# Patient Record
Sex: Female | Born: 1940 | Race: Black or African American | Hispanic: No | Marital: Married | Smoking: Never smoker
Health system: Southern US, Community
[De-identification: ages and names within clinical notes are randomized; demographics above are authoritative.]

---

## 2019-10-28 ENCOUNTER — Emergency Department (HOSPITAL_BASED_OUTPATIENT_CLINIC_OR_DEPARTMENT_OTHER)
Admission: EM | Admit: 2019-10-28 | Discharge: 2019-10-28 | Disposition: A | Discharge status: Home / Self Care | Payer: Medicare Other | Attending: Emergency Medicine | Admitting: Emergency Medicine

## 2019-10-28 ENCOUNTER — Encounter (HOSPITAL_BASED_OUTPATIENT_CLINIC_OR_DEPARTMENT_OTHER): Payer: Self-pay | Admitting: *Deleted

## 2019-10-28 ENCOUNTER — Other Ambulatory Visit: Payer: Self-pay

## 2019-10-28 ENCOUNTER — Emergency Department (HOSPITAL_BASED_OUTPATIENT_CLINIC_OR_DEPARTMENT_OTHER): Payer: Medicare Other

## 2019-10-28 DIAGNOSIS — Z7982 Long term (current) use of aspirin: Secondary | ICD-10-CM | POA: Diagnosis not present

## 2019-10-28 DIAGNOSIS — L0291 Cutaneous abscess, unspecified: Secondary | ICD-10-CM

## 2019-10-28 DIAGNOSIS — L0211 Cutaneous abscess of neck: Secondary | ICD-10-CM | POA: Diagnosis not present

## 2019-10-28 DIAGNOSIS — R221 Localized swelling, mass and lump, neck: Secondary | ICD-10-CM | POA: Diagnosis present

## 2019-10-28 LAB — BASIC METABOLIC PANEL
Anion gap: 13 (ref 5–15)
BUN: 14 mg/dL (ref 8–23)
CO2: 23 mmol/L (ref 22–32)
Calcium: 9.4 mg/dL (ref 8.9–10.3)
Chloride: 105 mmol/L (ref 98–111)
Creatinine, Ser: 0.57 mg/dL (ref 0.44–1.00)
GFR calc Af Amer: >60 mL/min (ref >60)
GFR calc non Af Amer: >60 mL/min (ref >60)
Glucose, Bld: 113 mg/dL — ABNORMAL HIGH (ref 70–99)
Potassium: 3.8 mmol/L (ref 3.5–5.1)
Sodium: 141 mmol/L (ref 135–145)

## 2019-10-28 LAB — CBC WITH DIFFERENTIAL/PLATELET
Abs Immature Granulocytes: 0.01 10*3/uL (ref 0.00–0.07)
Basophils Absolute: 0 10*3/uL (ref 0.0–0.1)
Basophils Relative: 0 %
Eosinophils Absolute: 0 10*3/uL (ref 0.0–0.5)
Eosinophils Relative: 1 %
HCT: 39.2 % (ref 36.0–46.0)
Hemoglobin: 12.8 g/dL (ref 12.0–15.0)
Immature Granulocytes: 0 %
Lymphocytes Relative: 19 %
Lymphs Abs: 1.1 10*3/uL (ref 0.7–4.0)
MCH: 31 pg (ref 26.0–34.0)
MCHC: 32.7 g/dL (ref 30.0–36.0)
MCV: 94.9 fL (ref 80.0–100.0)
Monocytes Absolute: 0.5 10*3/uL (ref 0.1–1.0)
Monocytes Relative: 8 %
Neutro Abs: 4.3 10*3/uL (ref 1.7–7.7)
Neutrophils Relative %: 72 %
Platelets: 223 10*3/uL (ref 150–400)
RBC: 4.13 MIL/uL (ref 3.87–5.11)
RDW: 12.9 % (ref 11.5–15.5)
WBC: 6 10*3/uL (ref 4.0–10.5)
nRBC: 0 % (ref 0.0–0.2)

## 2019-10-28 MED ORDER — LIDOCAINE HCL (PF) 1 % IJ SOLN
10.0000 mL | Freq: Once | INTRAMUSCULAR | Status: AC
  Administered 2019-10-28: 10 mL via INTRADERMAL
  Filled 2019-10-28: qty 10

## 2019-10-28 MED ORDER — KETOROLAC TROMETHAMINE 15 MG/ML IJ SOLN
15.0000 mg | Freq: Once | INTRAMUSCULAR | Status: AC
  Administered 2019-10-28: 15 mg via INTRAVENOUS
  Filled 2019-10-28: qty 1

## 2019-10-28 MED ORDER — CEPHALEXIN 500 MG PO CAPS: 500.0000 mg | ORAL_CAPSULE | Freq: Four times a day (QID) | ORAL | 0 refills | Status: AC

## 2019-10-28 MED ORDER — CEPHALEXIN 250 MG PO CAPS
500.0000 mg | ORAL_CAPSULE | Freq: Once | ORAL | Status: AC
  Administered 2019-10-28: 500 mg via ORAL
  Filled 2019-10-28: qty 2

## 2019-10-28 MED ORDER — IOHEXOL 300 MG/ML  SOLN
100.0000 mL | Freq: Once | INTRAMUSCULAR | Status: AC
  Administered 2019-10-28: 75 mL via INTRAVENOUS

## 2019-10-28 NOTE — Discharge Instructions (Addendum)
You should return to our ER or visit your doctor in 4-5 days to have your packing removed from your wound and have your abscess re-examined.  Please take the full course of antibiotics as prescribed.  You can take tylenol or motrin at home for pain.

## 2019-10-28 NOTE — ED Triage Notes (Signed)
Pt sent here from [facility]D office for eval abscess to neck

## 2019-10-28 NOTE — ED Provider Notes (Signed)
[facility] Provider Note   CSN: [id number] Arrival date & time: 10/28/19  1710     History Chief Complaint  Patient presents with  . Abscess    Georgina Krist is a 79 y.o. female presenting to ED with anterior neck mass  Popped pimple on chin last week, some swelling there at the time Got covid vaccine Saturday, swelling worsened since then. Went to see PCP today who told her to come to ED for drainage  No fevers, chills No difficulty speaking or swallowing No tongue swelling No hx of diabetes   HPI     History reviewed. No pertinent past medical history.  There are no problems to display for this patient.   History reviewed. No pertinent surgical history.   OB History   No obstetric history on file.     No family history on file.  Social History   Tobacco Use  . Smoking status: Never Smoker  Substance Use Topics  . Alcohol use: Not Currently  . Drug use: Not Currently    Home Medications Prior to Admission medications   Medication Sig Start Date End Date Taking? Authorizing Provider  aspirin 81 MG EC tablet Take by mouth. 06/26/16  Yes [provider]  cephALEXin (KEFLEX) 500 MG capsule Take 1 capsule (500 mg total) by mouth 4 (four) times daily for 5 days. 10/29/19 11/03/19  Wyvonnia Dusky, MD    Allergies    Patient has no known allergies.  Review of Systems   Review of Systems  Constitutional: Negative for chills and fever.  HENT: Positive for facial swelling. Negative for dental problem, drooling, sore throat, trouble swallowing and voice change.   Respiratory: Negative for cough and shortness of breath.   Cardiovascular: Negative for chest pain and palpitations.  Skin: Positive for wound. Negative for rash.  Neurological: Negative for syncope and headaches.  All other systems reviewed and are negative.   Physical Exam Updated Vital Signs BP (!) 151/79   Pulse 72   Temp 98.1 F (36.7 C) (Oral)    Resp 18   Ht 6' (1.829 m)   SpO2 98%   Physical Exam Vitals and nursing note reviewed.  Constitutional:      General: She is not in acute distress.    Appearance: She is well-developed.  HENT:     Head: Normocephalic and atraumatic.     Comments: Small superficial 2 cm region of fluctuance in right submandibular region, with surrounding tender tissue induration No swelling or elevation of the tongue No expression of purulence into the mouth Eyes:     Conjunctiva/sclera: Conjunctivae normal.  Cardiovascular:     Rate and Rhythm: Normal rate and regular rhythm.     Pulses: Normal pulses.  Pulmonary:     Effort: Pulmonary effort is normal. No respiratory distress.  Musculoskeletal:     Cervical back: Neck supple.  Skin:    General: Skin is warm and dry.  Neurological:     Mental Status: She is alert.     ED Results / Procedures / Treatments   Labs (all labs ordered are listed, but only abnormal results are displayed) Labs Reviewed  BASIC METABOLIC PANEL - Abnormal; Notable for the following components:      Result Value   Glucose, Bld 113 (*)    All other components within normal limits  CBC WITH DIFFERENTIAL/PLATELET    EKG None  Radiology CT Soft Tissue Neck W Contrast  Result Date: 10/28/2019 CLINICAL  DATA:  Submandibular mass lesion.  Question abscess. EXAM: CT NECK WITH CONTRAST TECHNIQUE: Multidetector CT imaging of the neck was performed using the standard protocol following the bolus administration of intravenous contrast. CONTRAST:  70mL OMNIPAQUE IOHEXOL 300 MG/ML  SOLN COMPARISON:  None. FINDINGS: Pharynx and larynx: No mucosal or submucosal lesion. Salivary glands: Parotid and submandibular glands are normal. Thyroid: Normal Lymph nodes: No enlarged or low-density nodes seen on either side of the neck. Vascular: No abnormal vascular finding. Limited intracranial: Normal Visualized orbits: Normal Mastoids and visualized paranasal sinuses: Cyst or polyp in the  right division of the sphenoid sinus. Other sinuses are clear. Skeleton: Ordinary cervical spondylosis. Upper chest: Negative Other: 2.5 cm superficial subcutaneous abscess to the right of midline in the submandibular region. This shows central low density. No extension deep to the platysma muscle. Mild regional cellulitis changes present as well superficially. IMPRESSION: 2.5 cm superficial abscess to the right of midline in the submandibular region. No extension deep to the platysma muscle. Regional cellulitis of the adjacent fat and skin. Electronically Signed   By: Paulina Fusi M.D.   On: 10/28/2019 22:02    Procedures .Marland KitchenIncision and Drainage  Date/Time: 10/29/2019 2:04 AM Performed by: Terald Sleeper, MD Authorized by: Terald Sleeper, MD   Consent:    Consent obtained:  Verbal   Consent given by:  Patient   Risks discussed:  Bleeding, incomplete drainage and infection Location:    Type:  Abscess   Size:  2.5 cm   Location:  Neck   Neck location:  R anterior Pre-procedure details:    Skin preparation:  Betadine Anesthesia (see MAR for exact dosages):    Anesthesia method:  Local infiltration   Local anesthetic:  Lidocaine 1% w/o epi Procedure type:    Complexity:  Complex Procedure details:    Incision types:  Single straight   Wound management:  Probed and deloculated, irrigated with saline and extensive cleaning   Drainage:  Purulent   Drainage amount:  Copious   Wound treatment:  Wound left open   Packing materials:  1/4 in gauze Post-procedure details:    Patient tolerance of procedure:  Tolerated well, no immediate complications Comments:     Approx 8-10 cc purulent drainage   (including critical care time)  Medications Ordered in ED Medications  iohexol (OMNIPAQUE) 300 MG/ML solution 100 mL (75 mLs Intravenous Contrast Given 10/28/19 2147)  lidocaine (PF) (XYLOCAINE) 1 % injection 10 mL (10 mLs Intradermal Given 10/28/19 2327)  ketorolac (TORADOL) 15 MG/ML  injection 15 mg (15 mg Intravenous Given 10/28/19 2230)  cephALEXin (KEFLEX) capsule 500 mg (500 mg Oral Given 10/28/19 2322)    ED Course  I have reviewed the triage vital signs and the nursing notes.  Pertinent labs & imaging results that were available during my care of the patient were reviewed by me and considered in my medical decision making (see chart for details).  79 yo female presenting to the ED with abscess in the right submandibular region that started after popping a "blackhead."  CT imaging with:  Other: 2.5 cm superficial subcutaneous abscess to the right of  midline in the submandibular region. This shows central low density.  No extension deep to the platysma muscle. Mild regional cellulitis  changes present as well superficially.    Low suspicion for ludwig's angina  Plan to I&D abscess, will start on keflex.  Clinical Course as of Oct 28 1252  Wed Oct 28, 2019  2222 IMPRESSION:  2.5 cm superficial abscess to the right of midline in the submandibular region. No extension deep to the platysma muscle. Regional cellulitis of the adjacent fat and skin.   [MT]    Clinical Course User Index [MT] Nickisha Hum, Kermit Balo, MD    Final Clinical Impression(s) / ED Diagnoses Final diagnoses:  Abscess    Rx / DC Orders ED Discharge Orders         Ordered    cephALEXin (KEFLEX) 500 MG capsule  4 times daily     10/28/19 2318           Terald Sleeper, MD 10/29/19 1254

## 2020-06-18 IMAGING — CT CT NECK W/ CM
4 of 5 series · 15 of 33 positions shown, 18 images · IV contrast (omnipaque)
Comparison: None.

CLINICAL DATA: Submandibular mass lesion.  Question abscess.

EXAM:
CT NECK WITH CONTRAST
TECHNIQUE: Multidetector CT imaging of the neck was performed using the
standard protocol following the bolus administration of intravenous
contrast.
CONTRAST:  75mL OMNIPAQUE IOHEXOL 300 MG/ML  SOLN

[Series 4: sag neck · sagittal · 0.50mm/px · 5 of 101 slices shown, 6 images]
[im 34/101  bone]
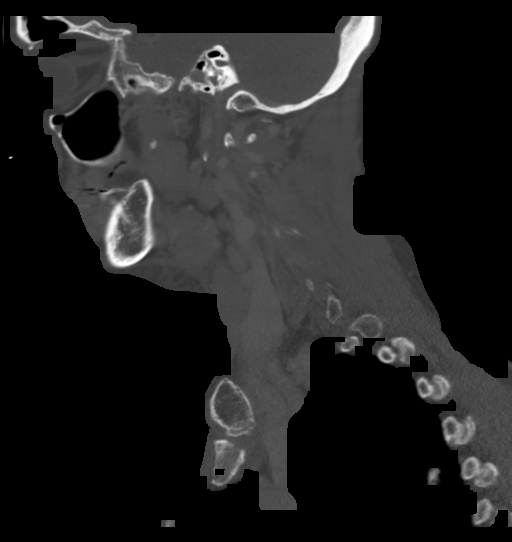
[im 42/101  bone]
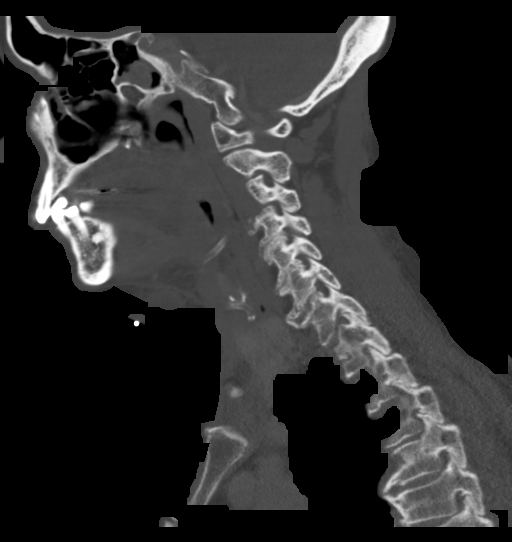
[im 51/101  soft-tissue]
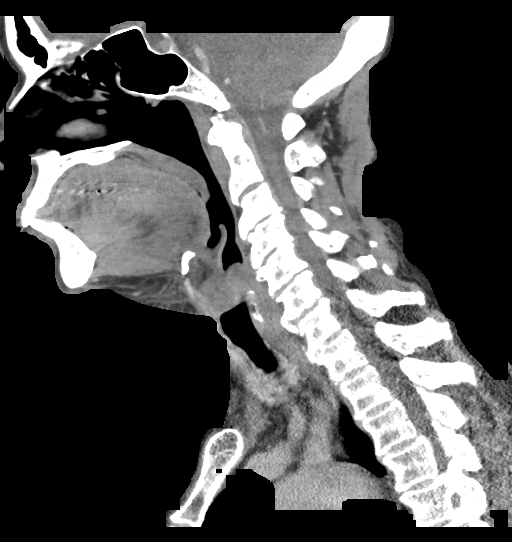
[im 51/101  bone]
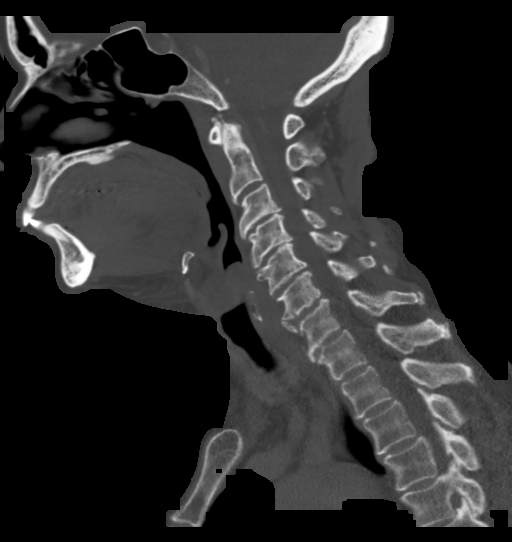
[im 59/101  bone]
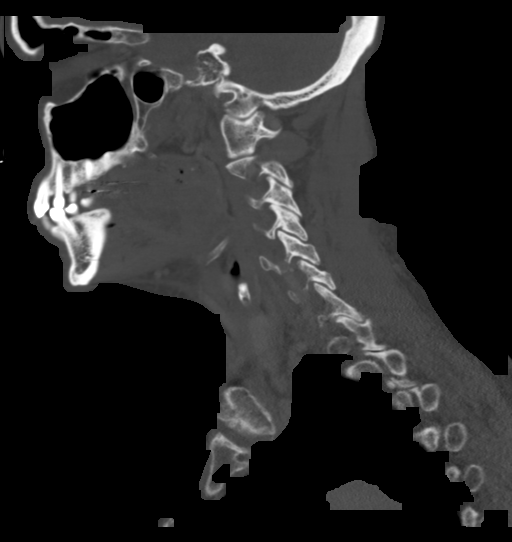
[im 67/101  bone]
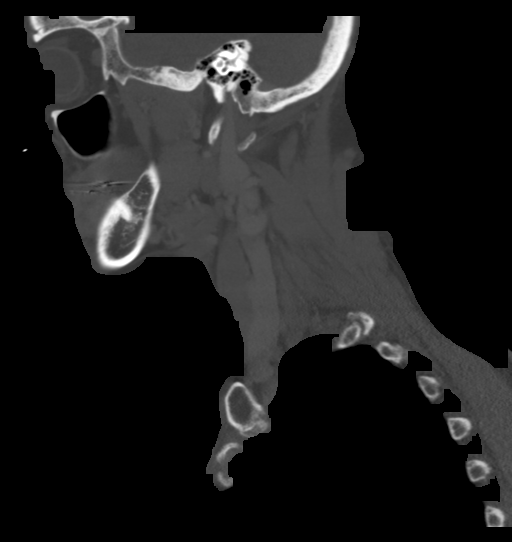

[Series 5: cor neck · coronal · 0.49mm/px · 3 of 119 slices shown]
[im 24/119  bone]
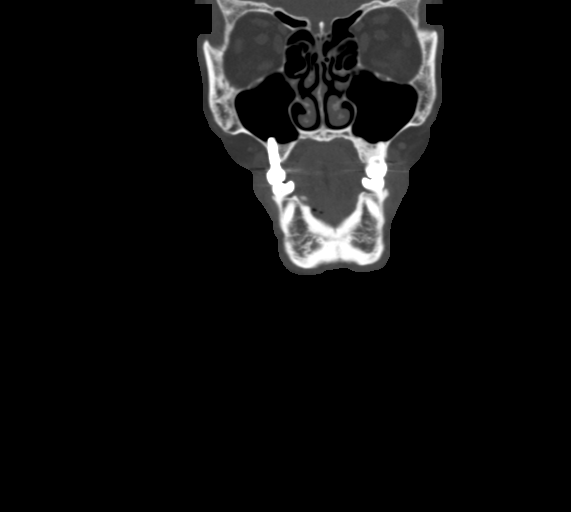
[im 48/119  bone]
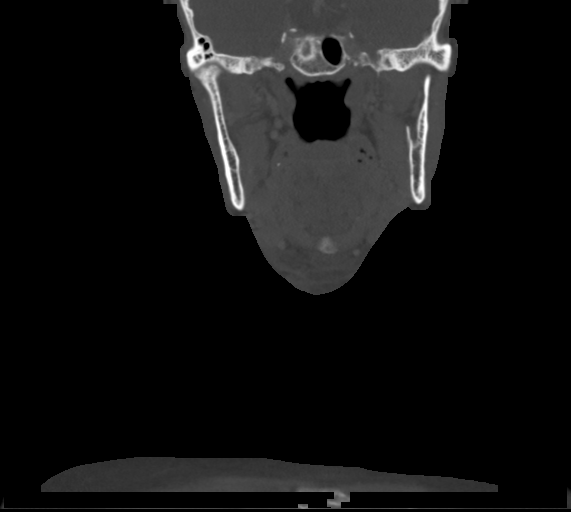
[im 71/119  bone]
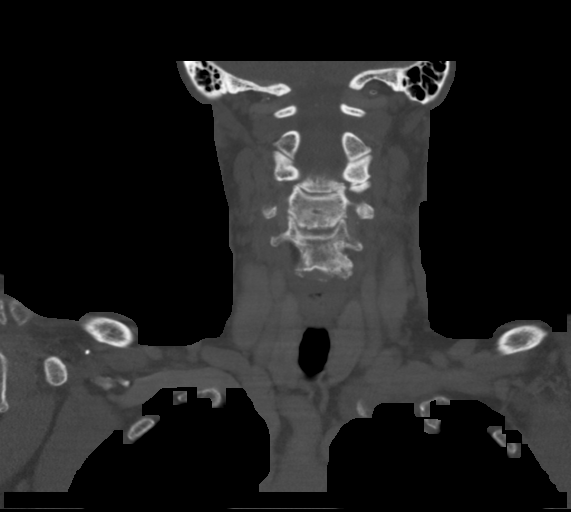

[Series 6: orthogonal ax · axial · 0.39mm/px · z∈[-173,-89]mm · 2 of 128 slices shown]
[im 43/128  bone]
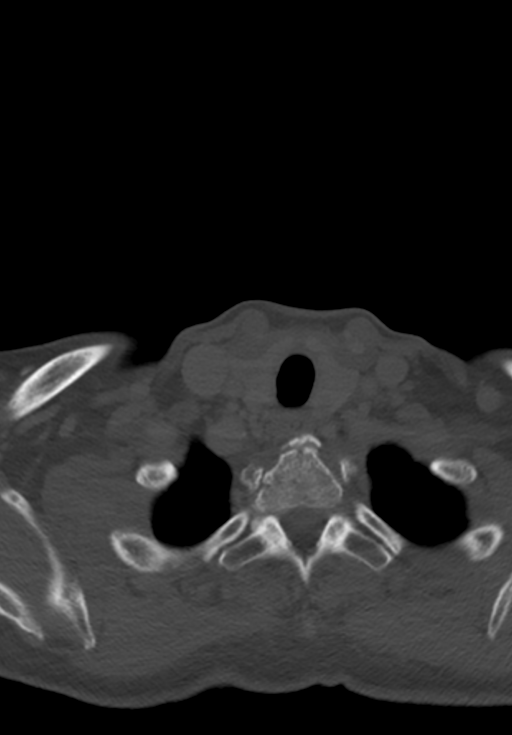
[im 85/128  bone]
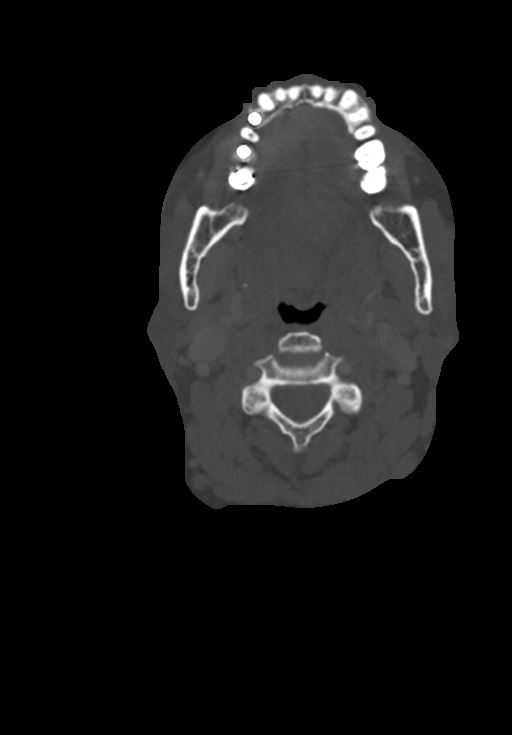

[Series 7: thins · axial · 0.67mm/px · z∈[-213,-45]mm · 5 of 253 slices shown, 7 images]
[im 43/253  soft-tissue]
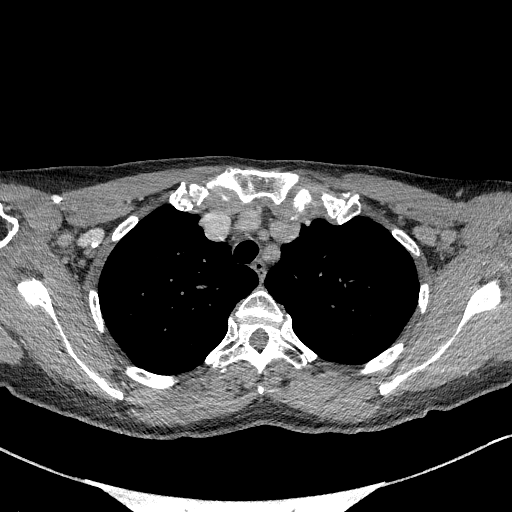
[im 43/253  bone]
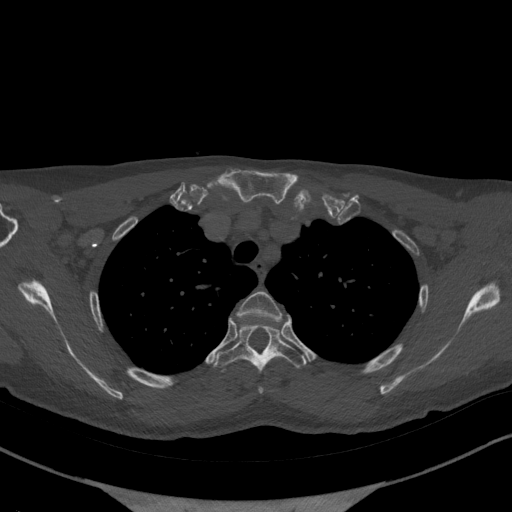
[im 85/253  bone]
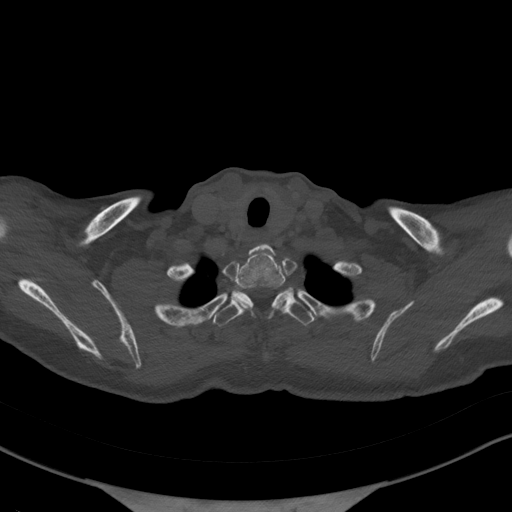
[im 127/253  bone]
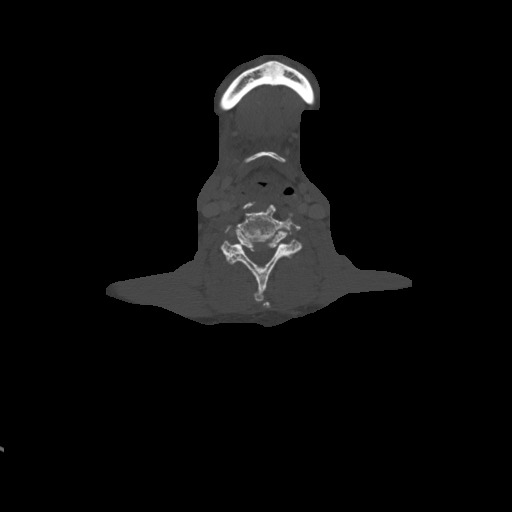
[im 169/253  bone]
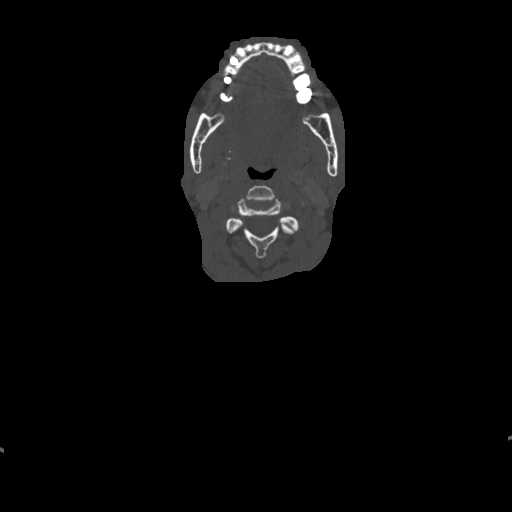
[im 211/253  soft-tissue]
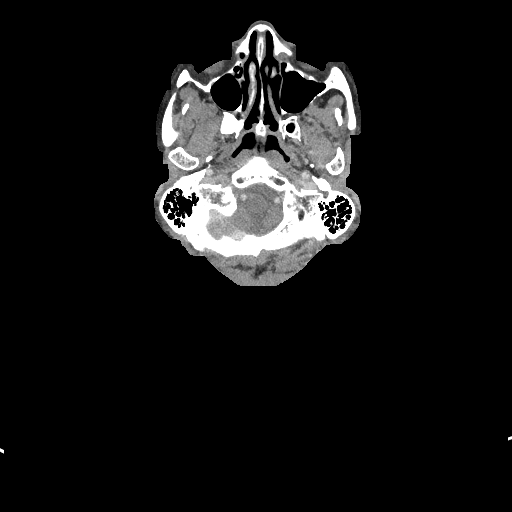
[im 211/253  bone]
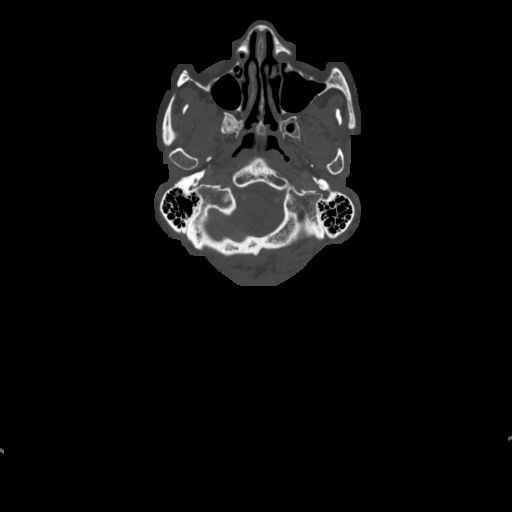

[15 of 33 positions shown; findings below may reference images not displayed]

FINDINGS: Pharynx and larynx: No mucosal or submucosal lesion.

Salivary glands: Parotid and submandibular glands are normal.

Thyroid: Normal

Lymph nodes: No enlarged or low-density nodes seen on either side of
the neck.

Vascular: No abnormal vascular finding.

Limited intracranial: Normal

Visualized orbits: Normal

Mastoids and visualized paranasal sinuses: Cyst or polyp in the
right division of the sphenoid sinus. Other sinuses are clear.

Skeleton: Ordinary cervical spondylosis.

Upper chest: Negative

Other: 2.5 cm superficial subcutaneous abscess to the right of
midline in the submandibular region. This shows central low density.
No extension deep to the platysma muscle. Mild regional cellulitis
changes present as well superficially.
IMPRESSION: 2.5 cm superficial abscess to the right of midline in the
submandibular region. No extension deep to the platysma muscle.
Regional cellulitis of the adjacent fat and skin.
